# Patient Record
Sex: Female | Born: 1965 | Race: Black or African American | Hispanic: No | Marital: Married | Smoking: Never smoker
Health system: Southern US, Community
[De-identification: ages and names within clinical notes are randomized; demographics above are authoritative.]

---

## 2007-01-29 ENCOUNTER — Ambulatory Visit: Payer: Self-pay | Admitting: Nurse Practitioner

## 2008-04-22 ENCOUNTER — Ambulatory Visit: Payer: Self-pay | Admitting: Nurse Practitioner

## 2009-04-15 ENCOUNTER — Ambulatory Visit: Payer: Self-pay | Admitting: Internal Medicine

## 2009-04-24 ENCOUNTER — Ambulatory Visit: Payer: Self-pay | Admitting: Nurse Practitioner

## 2011-07-18 IMAGING — MG MM DIGITAL SCREENING BILAT W/ CAD
1 series · 4 of 4 positions shown · non-contrast
Comparison: none

REASON FOR EXAM: scr
COMMENTS:

[Series 2335: R CC · right · 4 of 4 slices shown]
[im 1/4]
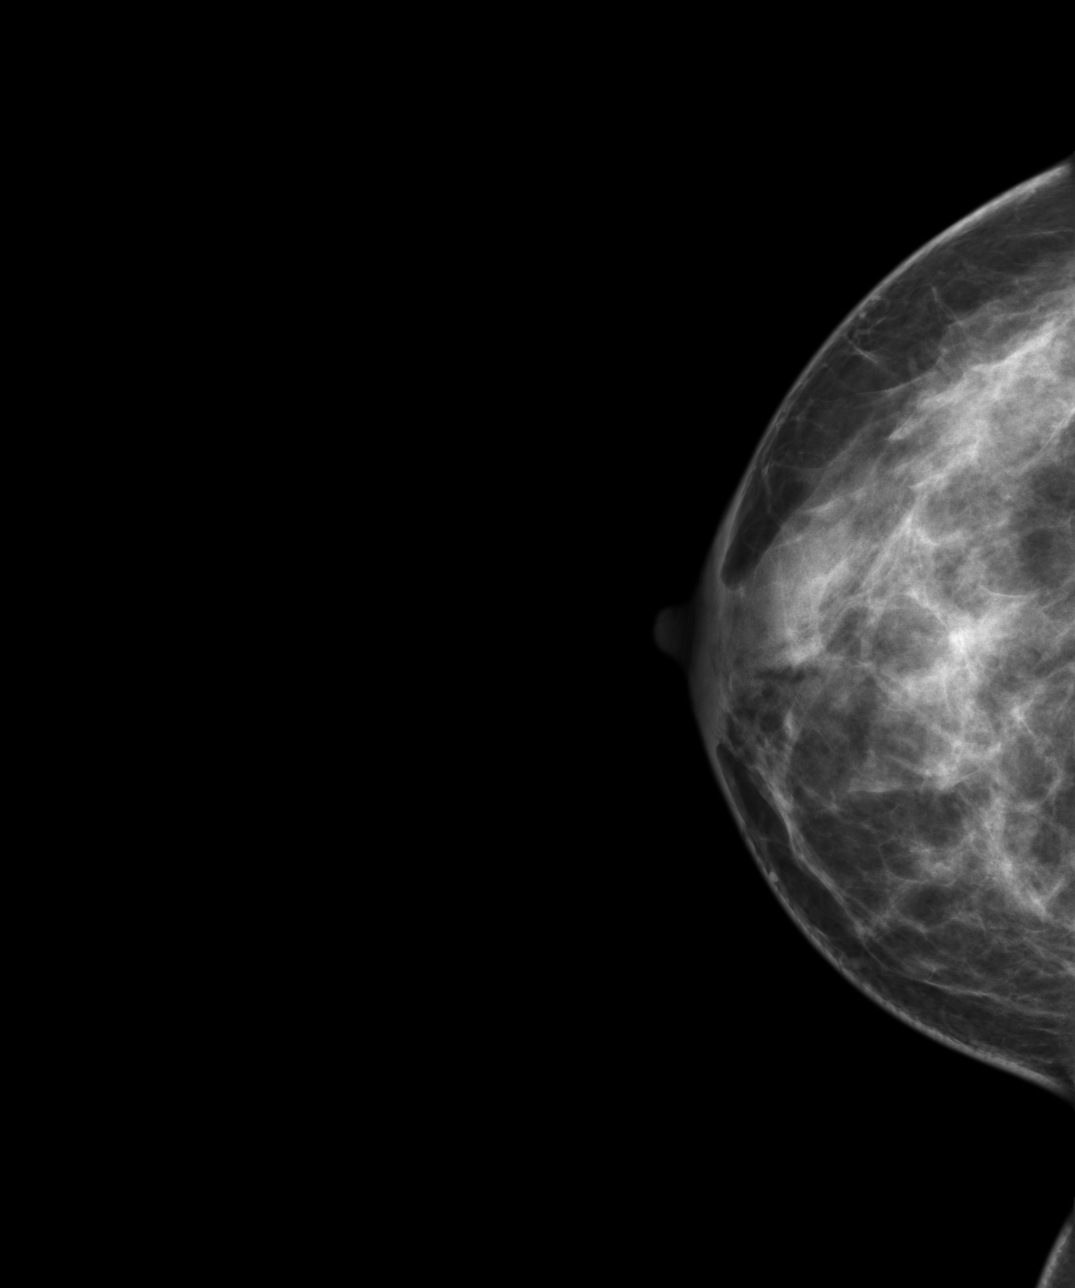
[im 2/4]
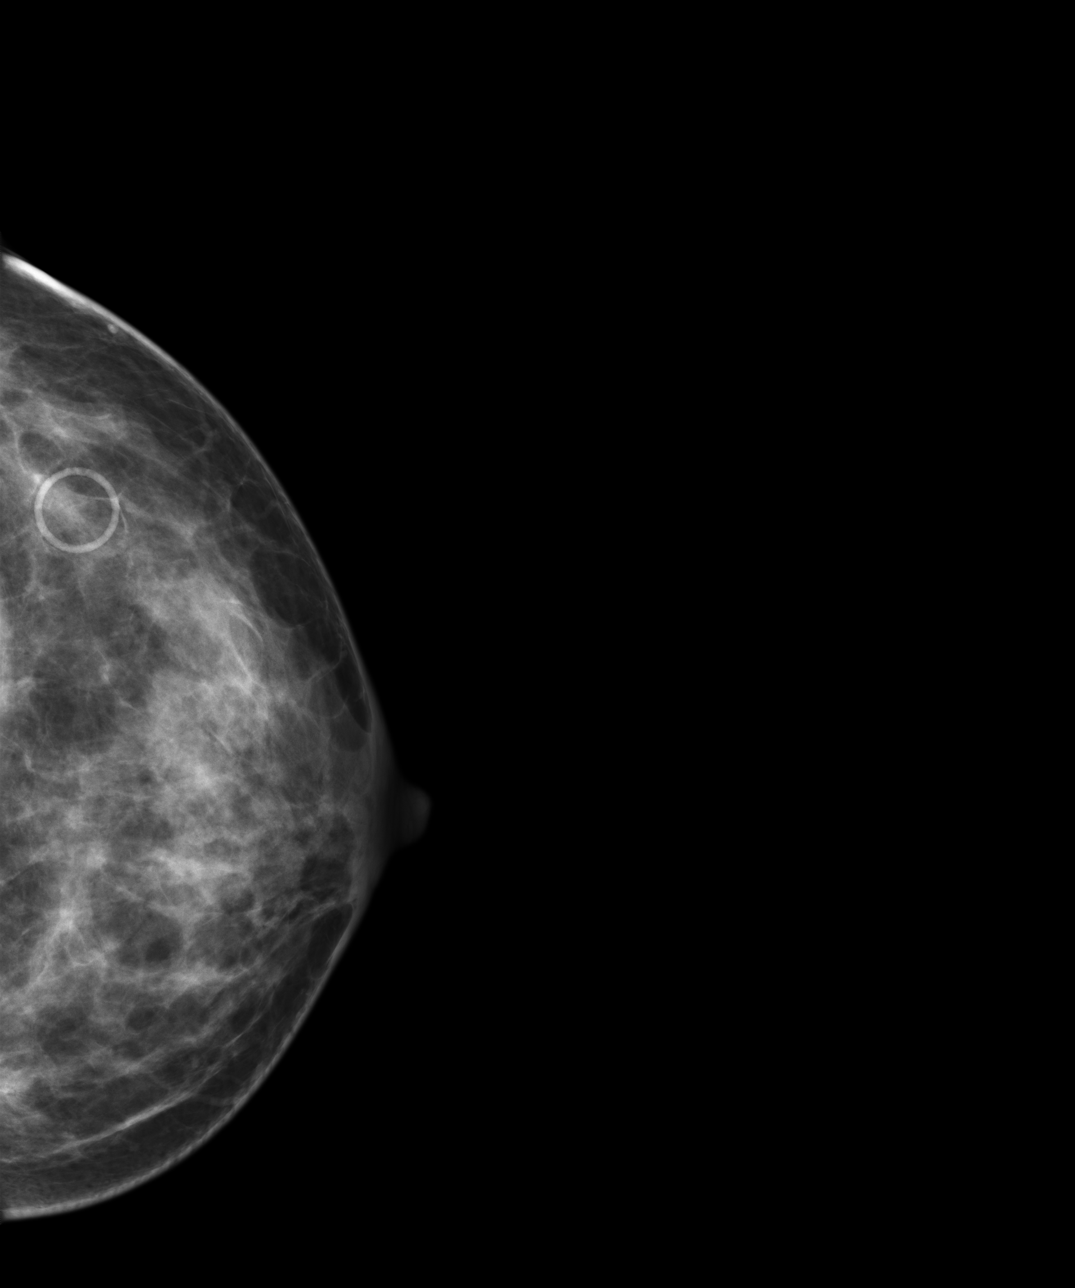
[im 3/4]
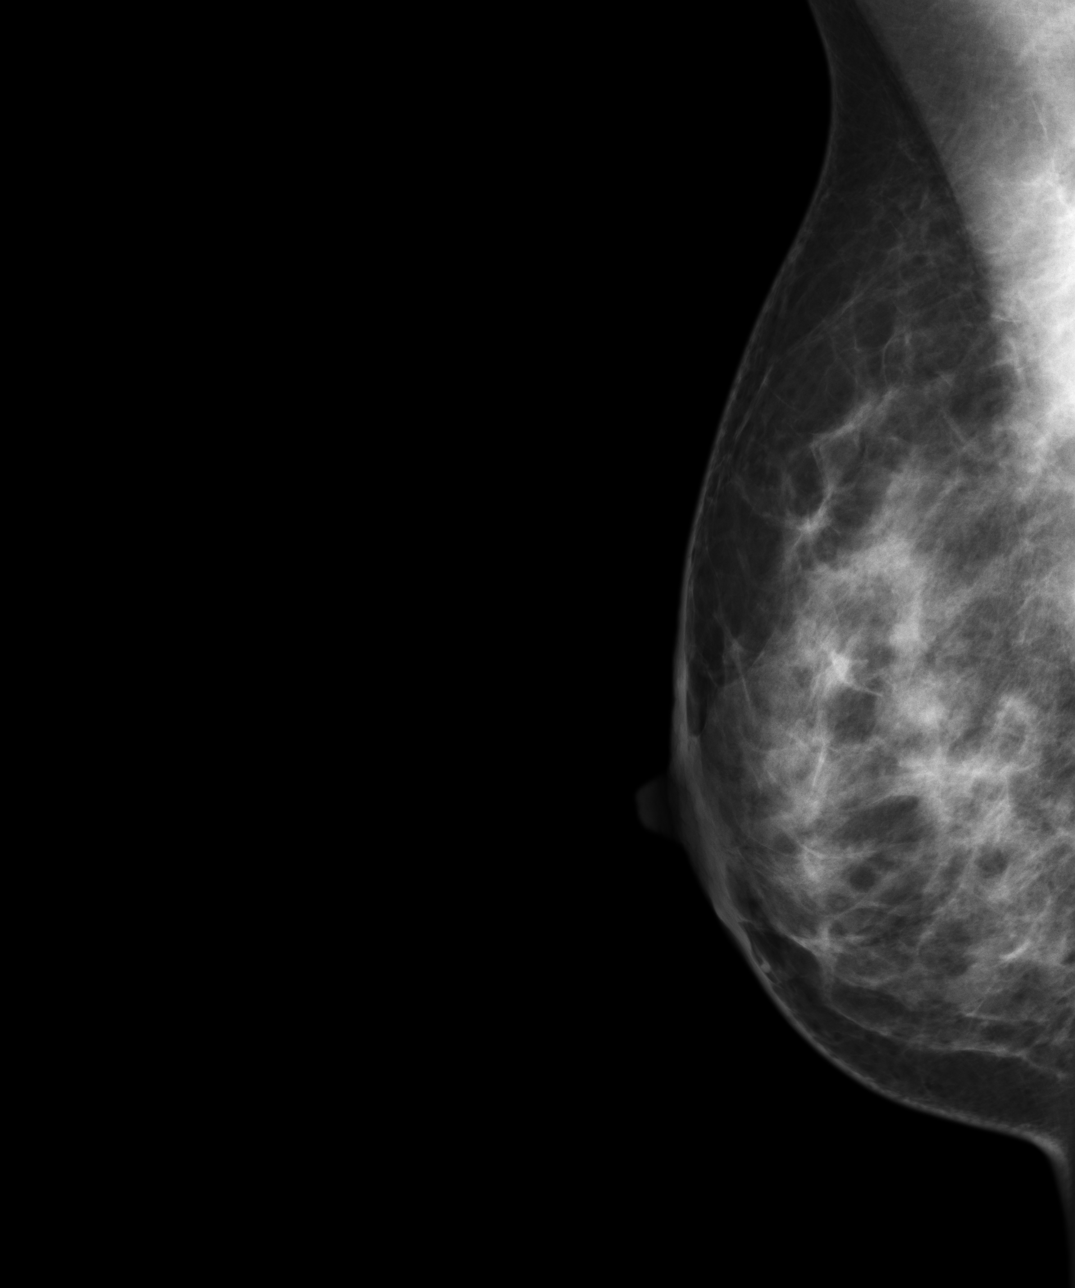
[im 4/4]
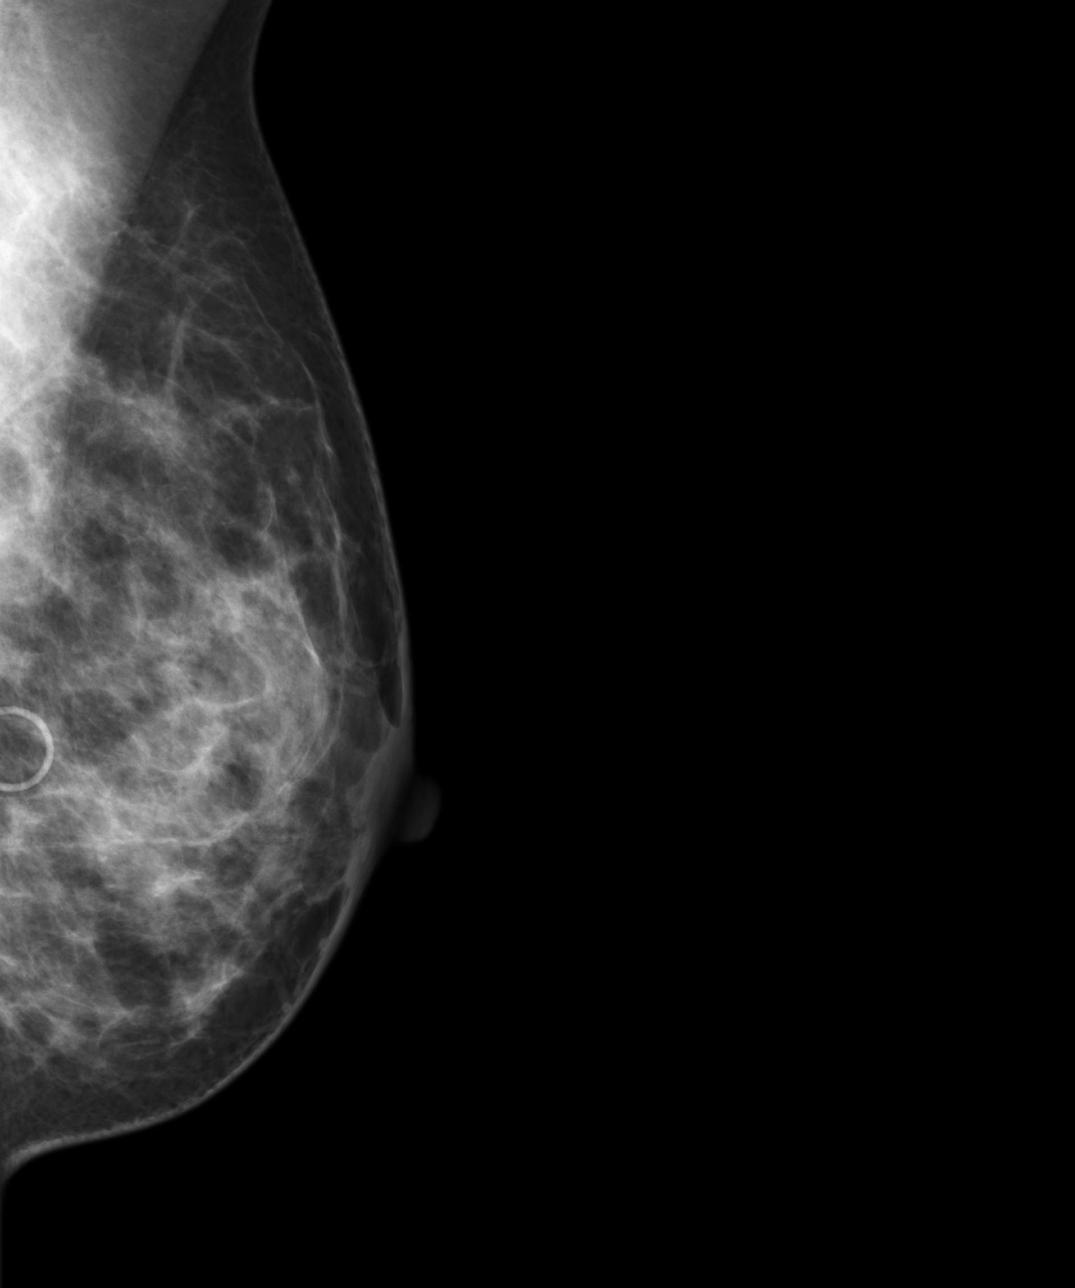

[4 of 4 positions shown; findings below may reference images not displayed]

PROCEDURE:     MMM - MMM DGTL SCREENING MAMMO W/CAD  - April 24, 2009  [DATE]

RESULT:       Comparison is made to the study of 04/22/08 and to images of
01/29/07 both of which are previous digital exams.

The breasts exhibit a moderately dense parenchymal pattern which has a
stable appearance.  There is no developing density, dominant mass or
malignant calcification. There is no architectural distortion.
IMPRESSION: 1.     Stable benign-appearing bilateral mammogram.
2.     BI-RADS:  Category 2- Benign Finding.
3.     Please continue to encourage annual mammographic follow up.

A negative mammogram report does not preclude biopsy or other evaluation of
a clinically palpable or otherwise suspicious mass or lesion. Breast cancer
may not be detected by mammography in up to 10% of cases.

## 2011-10-04 ENCOUNTER — Ambulatory Visit: Payer: Self-pay | Admitting: Family Medicine

## 2016-01-28 ENCOUNTER — Encounter: Payer: Self-pay | Admitting: *Deleted

## 2016-01-28 ENCOUNTER — Ambulatory Visit
Admission: EM | Admit: 2016-01-28 | Discharge: 2016-01-28 | Disposition: A | Discharge status: Home / Self Care | Payer: Worker's Compensation | Attending: Family Medicine | Admitting: Family Medicine

## 2016-01-28 DIAGNOSIS — M25531 Pain in right wrist: Secondary | ICD-10-CM

## 2016-01-28 DIAGNOSIS — M7522 Bicipital tendinitis, left shoulder: Secondary | ICD-10-CM | POA: Diagnosis not present

## 2016-01-28 MED ORDER — MELOXICAM 15 MG PO TABS: 15.0000 mg | ORAL_TABLET | Freq: Every day | ORAL | 1 refills | Status: AC

## 2016-01-28 NOTE — ED Notes (Signed)
Urine drug screen performed by S. Quinton Voth. One successful attempt made with adequate amount of urine to split the sample. No water was given.

## 2016-01-28 NOTE — ED Provider Notes (Signed)
[facility]    CSN: [id number] Arrival date & time: 01/28/16  1133     History   Chief Complaint Chief Complaint  Patient presents with  . Wrist Pain  . Shoulder Pain    HPI Whitney Myers is a 51 y.o. female.   Patient is a 52 year old black female who has multiple complaints. She states that her right wrist starting to bother her. Most the pain and tenderness is over the right radial area. She has some discomfort over the left wrist as well but nothing like on the right wrist. She also has tenderness over the left shoulder as well. The reason she comes in is because of the right wrist pain and the left shoulder pain. She denies any drug allergies she does not smoke or chew tobacco. Other than C-sections other surgeries no pertinent medical problems. No significant or pertinent family medical history relevant to today's visit. 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 She denies any previous history of her wrist or shoulder injury and pain   The history is provided by the patient.  Wrist Pain  This is a new problem. The current episode started more than 1 week ago. The problem occurs constantly. The problem has been gradually worsening. Pertinent negatives include no chest pain, no abdominal pain, no headaches and no shortness of breath. The symptoms are aggravated by bending. Nothing relieves the symptoms. The treatment provided no relief.  Shoulder Pain  Location:  Shoulder Shoulder location:  L shoulder Injury: no   Pain details:    Quality:  Sharp   Radiates to:  Does not radiate   Severity:  Moderate   Onset quality:  Sudden   Timing:  Constant   Progression:  Worsening Handedness:  Right-handed Dislocation: no   Prior injury to area:  No Relieved by:  Nothing Worsened by:  Nothing Ineffective treatments:  None tried   History reviewed. No pertinent past medical history.  There are no  active problems to display for this patient.   Past Surgical History:  Procedure Laterality Date  . CESAREAN SECTION     x2    OB History    No data available       Home Medications    Prior to Admission medications   Medication Sig Start Date End Date Taking? Authorizing Provider  meloxicam (MOBIC) 15 MG tablet Take 1 tablet (15 mg total) by mouth daily. 01/28/16   Hassan Rowan, MD    Family History Family History  Problem Relation Age of Onset  . Diabetes Mother     Social History Social History  Substance Use Topics  . Smoking status: Never Smoker  . Smokeless tobacco: Never Used  . Alcohol use No     Allergies   Patient has no known allergies.   Review of Systems Review of Systems  Respiratory: Negative for shortness of breath.   Cardiovascular: Negative for chest pain.  Gastrointestinal: Negative for abdominal pain.  Musculoskeletal: Positive for arthralgias, joint swelling and myalgias.  Skin: Negative.   Neurological: Negative for headaches.  All other systems reviewed and are negative.    Physical Exam Triage Vital Signs ED Triage Vitals  Enc Vitals Group     BP 01/28/16 1208 130/69     Pulse Rate 01/28/16 1208 98     Resp 01/28/16 1208 16     Temp 01/28/16 1208 98.2 F (36.8  C)     Temp Source 01/28/16 1208 Oral     SpO2 01/28/16 1208 100 %     Weight 01/28/16 1210 135 lb (61.2 kg)     Height 01/28/16 1210 5\' 8"  (1.727 m)     Head Circumference --      Peak Flow --      Pain Score 01/28/16 1213 8     Pain Loc --      Pain Edu? --      Excl. in GC? --    No data found.   Updated Vital Signs BP 130/69 (BP Location: Right Arm)   Pulse 98   Temp 98.2 F (36.8 C) (Oral)   Resp 16   Ht 5\' 8"  (1.727 m)   Wt 135 lb (61.2 kg)   LMP 08/25/2015   SpO2 100%   BMI 20.53 kg/m   Visual Acuity Right Eye Distance:   Left Eye Distance:   Bilateral Distance:    Right Eye Near:   Left Eye Near:    Bilateral Near:     Physical Exam    Constitutional: She appears well-developed and well-nourished.  HENT:  Head: Normocephalic.  Right Ear: External ear normal.  Left Ear: External ear normal.  Eyes: Pupils are equal, round, and reactive to light.  Neck: Normal range of motion. Neck supple.  Pulmonary/Chest: Effort normal.  Musculoskeletal: She exhibits tenderness.       Left shoulder: She exhibits tenderness. She exhibits no deformity and no spasm.       Right wrist: She exhibits tenderness. She exhibits no deformity and no laceration.       Arms: Patient is tenderness over the right radial wrist area no increased pain or tenderness with flexion of her wrist she also has some mild tendons of the left wrist but not nearly as bad as over the right wrist. This is consistent with possible de Quervain's tendinitis. She also has tenderness over the left bicipital tendon area no significant problems with racing either arms above her head or inverting her thumb down and keeping her arms out resisting pressure.  Neurological: She is alert.  Skin: Skin is warm and dry.  Psychiatric: She has a normal mood and affect.  Vitals reviewed.    UC Treatments / Results  Labs (all labs ordered are listed, but only abnormal results are displayed) Labs Reviewed - No data to display  EKG  EKG Interpretation None       Radiology No results found.  Procedures Procedures (including critical care time)  Medications Ordered in UC Medications - No data to display   Initial Impression / Assessment and Plan / UC Course  I have reviewed the triage vital signs and the nursing notes.  Pertinent labs & imaging results that were available during my care of the patient were reviewed by me and considered in my medical decision making (see chart for details).  Clinical Course     Right wrist tendinitis possible de Quervain's tendinitis. Left biceps tendinitis. Will place patient on Mobic 15 mg 1 tablet day refer her to NP Tommie Era Bumpers at [facility].  Final Clinical Impressions(s) / UC Diagnoses   Final diagnoses:  Right wrist pain  Biceps tendinitis of left shoulder    New Prescriptions Discharge Medication List as of 01/28/2016 12:52 PM    START taking these medications   Details  meloxicam (MOBIC) 15 MG tablet Take 1 tablet (15 mg total) by mouth daily.,  Starting Thu 01/28/2016, Normal          Note: This dictation was prepared with Dragon dictation along with smaller phrase technology. Any transcriptional errors that result from this process are unintentional.   Hassan RowanEugene Haywood Meinders, MD 01/28/16 [phone]

## 2016-01-28 NOTE — ED Triage Notes (Signed)
Patient started have bilateral wrist pain and left shoulder pain 3 weeks ago.

## 2017-05-12 ENCOUNTER — Ambulatory Visit
Admission: EM | Admit: 2017-05-12 | Discharge: 2017-05-12 | Disposition: A | Discharge status: Home / Self Care | Payer: 59 | Attending: Family Medicine | Admitting: Family Medicine

## 2017-05-12 DIAGNOSIS — K047 Periapical abscess without sinus: Secondary | ICD-10-CM | POA: Diagnosis not present

## 2017-05-12 MED ORDER — CLINDAMYCIN HCL 300 MG PO CAPS: 300.0000 mg | ORAL_CAPSULE | Freq: Four times a day (QID) | ORAL | 0 refills | Status: AC

## 2017-05-12 MED ORDER — HYDROCODONE-ACETAMINOPHEN 5-325 MG PO TABS: ORAL_TABLET | ORAL | 0 refills | Status: AC

## 2017-05-12 NOTE — ED Triage Notes (Signed)
As per patient left side has some abscess tooth is sore painful to eat can't sleep because of pain onset 2 days.

## 2017-05-12 NOTE — ED Provider Notes (Signed)
[facility]    CSN: [id number] Arrival date & time: 05/12/17  1841     History   Chief Complaint Chief Complaint  Patient presents with  . Dental Pain    HPI Whitney Myers is a 52 y.o. female.   The history is provided by the patient.  Dental Pain  Location:  Lower Lower teeth location:  30/RL 1st molar Quality:  Aching Severity:  Moderate Onset quality:  Sudden Duration:  2 days Timing:  Constant Progression:  Worsening Chronicity:  New Context: dental caries and poor dentition   Previous work-up:  Filled cavity Relieved by:  Nothing Ineffective treatments:  NSAIDs and acetaminophen Associated symptoms: gum swelling   Associated symptoms: no congestion, no difficulty swallowing, no drooling, no facial pain, no facial swelling, no fever, no headaches, no neck pain, no neck swelling, no oral bleeding, no oral lesions and no trismus   Risk factors: lack of dental care and periodontal disease   Risk factors: no alcohol problem, no cancer, no chewing tobacco use, no diabetes, no immunosuppression and no smoking     History reviewed. No pertinent past medical history.  There are no active problems to display for this patient.   Past Surgical History:  Procedure Laterality Date  . CESAREAN SECTION     x2    OB History   None      Home Medications    Prior to Admission medications   Medication Sig Start Date End Date Taking? Authorizing Provider  clindamycin (CLEOCIN) 300 MG capsule Take 1 capsule (300 mg total) by mouth 4 (four) times daily. 05/12/17   Payton Mccallum, MD  HYDROcodone-acetaminophen (NORCO/VICODIN) 5-325 MG tablet 1-2 tabs po bid prn 05/12/17   Payton Mccallum, MD  meloxicam (MOBIC) 15 MG tablet Take 1 tablet (15 mg total) by mouth daily. 01/28/16   Hassan Rowan, MD    Family History Family History  Problem Relation Age of Onset  . Diabetes Mother     Social History Social History   Tobacco Use  . Smoking status: Never Smoker   . Smokeless tobacco: Never Used  Substance Use Topics  . Alcohol use: No  . Drug use: No     Allergies   Amoxicillin   Review of Systems Review of Systems  Constitutional: Negative for fever.  HENT: Negative for congestion, drooling, facial swelling and mouth sores.   Musculoskeletal: Negative for neck pain.  Neurological: Negative for headaches.     Physical Exam Triage Vital Signs ED Triage Vitals  Enc Vitals Group     BP 05/12/17 1855 (!) 144/80     Pulse Rate 05/12/17 1855 96     Resp 05/12/17 1855 16     Temp 05/12/17 1855 99.1 F (37.3 C)     Temp Source 05/12/17 1855 Oral     SpO2 05/12/17 1855 100 %     Weight 05/12/17 1852 138 lb (62.6 kg)     Height 05/12/17 1852 5\' 8"  (1.727 m)     Head Circumference --      Peak Flow --      Pain Score 05/12/17 1852 10     Pain Loc --      Pain Edu? --      Excl. in GC? --    No data found.  Updated Vital Signs BP (!) 144/80 (BP Location: Left Arm)   Pulse 96   Temp 99.1 F (37.3 C) (Oral)   Resp 16   Ht 5'  8" (1.727 m)   Wt 138 lb (62.6 kg)   SpO2 100%   BMI 20.98 kg/m   Visual Acuity Right Eye Distance:   Left Eye Distance:   Bilateral Distance:    Right Eye Near:   Left Eye Near:    Bilateral Near:     Physical Exam  Constitutional: She appears well-developed and well-nourished. No distress.  HENT:  Mouth/Throat: Uvula is midline, oropharynx is clear and moist and mucous membranes are normal. Abnormal dentition. Dental abscesses and dental caries present. No tonsillar exudate.  Skin: She is not diaphoretic.  Nursing note and vitals reviewed.    UC Treatments / Results  Labs (all labs ordered are listed, but only abnormal results are displayed) Labs Reviewed - No data to display  EKG None Radiology No results found.  Procedures Procedures (including critical care time)  Medications Ordered in UC Medications - No data to display   Initial Impression / Assessment and Plan / UC  Course  I have reviewed the triage vital signs and the nursing notes.  Pertinent labs & imaging results that were available during my care of the patient were reviewed by me and considered in my medical decision making (see chart for details).       Final Clinical Impressions(s) / UC Diagnoses   Final diagnoses:  Dental infection  Dental abscess    ED Discharge Orders        Ordered    clindamycin (CLEOCIN) 300 MG capsule  4 times daily     05/12/17 1926    HYDROcodone-acetaminophen (NORCO/VICODIN) 5-325 MG tablet     05/12/17 1927     1. diagnosis reviewed with patient 2. rx as per orders above; reviewed possible side effects, interactions, risks and benefits  3. Recommend supportive treatment with salt water rinses, otc analgesics prn 4. Follow-up prn if symptoms worsen or don't improve  Controlled Substance Prescriptions [place] Controlled Substance Registry consulted? Yes, I have consulted the [place] Controlled Substances Registry for this patient, and feel the risk/benefit ratio today is favorable for proceeding with this prescription for a controlled substance.   Payton Mccallumonty, Paloma Grange, MD 05/12/17 1935
